# Patient Record
Sex: Female | Born: 1985 | Race: White | Hispanic: No | Marital: Married | Smoking: Current every day smoker
Health system: Southern US, Community
[De-identification: ages and names within clinical notes are randomized; demographics above are authoritative.]

## PROBLEM LIST (undated history)

## (undated) DIAGNOSIS — A6920 Lyme disease, unspecified: Secondary | ICD-10-CM

## (undated) HISTORY — PX: TONSILLECTOMY: SUR1361

## (undated) HISTORY — PX: TUBAL LIGATION: SHX77

## (undated) HISTORY — PX: ABDOMINAL SURGERY: SHX537

## (undated) HISTORY — PX: WISDOM TOOTH EXTRACTION: SHX21

---

## 2016-05-22 ENCOUNTER — Encounter: Payer: Self-pay | Admitting: Obstetrics & Gynecology

## 2016-05-24 ENCOUNTER — Emergency Department (HOSPITAL_COMMUNITY)
Admission: EM | Admit: 2016-05-24 | Discharge: 2016-05-24 | Disposition: A | Discharge status: Home / Self Care | Payer: Medicaid Other | Attending: Emergency Medicine | Admitting: Emergency Medicine

## 2016-05-24 ENCOUNTER — Encounter (HOSPITAL_COMMUNITY): Payer: Self-pay | Admitting: Emergency Medicine

## 2016-05-24 ENCOUNTER — Emergency Department (HOSPITAL_COMMUNITY): Payer: Medicaid Other

## 2016-05-24 DIAGNOSIS — F17210 Nicotine dependence, cigarettes, uncomplicated: Secondary | ICD-10-CM | POA: Diagnosis not present

## 2016-05-24 DIAGNOSIS — R1032 Left lower quadrant pain: Secondary | ICD-10-CM | POA: Diagnosis present

## 2016-05-24 DIAGNOSIS — Z792 Long term (current) use of antibiotics: Secondary | ICD-10-CM | POA: Diagnosis not present

## 2016-05-24 DIAGNOSIS — N760 Acute vaginitis: Secondary | ICD-10-CM | POA: Insufficient documentation

## 2016-05-24 DIAGNOSIS — R102 Pelvic and perineal pain: Secondary | ICD-10-CM | POA: Diagnosis not present

## 2016-05-24 DIAGNOSIS — R1030 Lower abdominal pain, unspecified: Secondary | ICD-10-CM

## 2016-05-24 DIAGNOSIS — B9689 Other specified bacterial agents as the cause of diseases classified elsewhere: Secondary | ICD-10-CM

## 2016-05-24 HISTORY — DX: Lyme disease, unspecified: A69.20

## 2016-05-24 LAB — URINALYSIS, ROUTINE W REFLEX MICROSCOPIC
Bilirubin Urine: NEGATIVE
Glucose, UA: NEGATIVE mg/dL
HGB URINE DIPSTICK: NEGATIVE
Ketones, ur: 40 mg/dL — AB
Leukocytes, UA: NEGATIVE
NITRITE: NEGATIVE
PROTEIN: NEGATIVE mg/dL
Specific Gravity, Urine: 1.02 (ref 1.005–1.030)
pH: 7 (ref 5.0–8.0)

## 2016-05-24 LAB — CBC
HCT: 42.4 % (ref 36.0–46.0)
Hemoglobin: 14.7 g/dL (ref 12.0–15.0)
MCH: 32.3 pg (ref 26.0–34.0)
MCHC: 34.7 g/dL (ref 30.0–36.0)
MCV: 93.2 fL (ref 78.0–100.0)
PLATELETS: 192 10*3/uL (ref 150–400)
RBC: 4.55 MIL/uL (ref 3.87–5.11)
RDW: 12.6 % (ref 11.5–15.5)
WBC: 6.8 10*3/uL (ref 4.0–10.5)

## 2016-05-24 LAB — LIPASE, BLOOD: Lipase: 19 U/L (ref 11–51)

## 2016-05-24 LAB — COMPREHENSIVE METABOLIC PANEL
ALK PHOS: 39 U/L (ref 38–126)
ALT: 13 U/L — AB (ref 14–54)
ANION GAP: 6 (ref 5–15)
AST: 17 U/L (ref 15–41)
Albumin: 4.9 g/dL (ref 3.5–5.0)
BILIRUBIN TOTAL: 2.5 mg/dL — AB (ref 0.3–1.2)
BUN: 10 mg/dL (ref 6–20)
CALCIUM: 9.4 mg/dL (ref 8.9–10.3)
CO2: 25 mmol/L (ref 22–32)
CREATININE: 0.54 mg/dL (ref 0.44–1.00)
Chloride: 107 mmol/L (ref 101–111)
Glucose, Bld: 98 mg/dL (ref 65–99)
Potassium: 3.8 mmol/L (ref 3.5–5.1)
Sodium: 138 mmol/L (ref 135–145)
TOTAL PROTEIN: 8 g/dL (ref 6.5–8.1)

## 2016-05-24 LAB — WET PREP, GENITAL
Sperm: NONE SEEN
TRICH WET PREP: NONE SEEN
YEAST WET PREP: NONE SEEN

## 2016-05-24 LAB — HCG, QUANTITATIVE, PREGNANCY: hCG, Beta Chain, Quant, S: <1 m[IU]/mL (ref <5)

## 2016-05-24 MED ORDER — MORPHINE SULFATE (PF) 4 MG/ML IV SOLN
INTRAVENOUS | Status: AC
  Filled 2016-05-24: qty 1

## 2016-05-24 MED ORDER — IOPAMIDOL (ISOVUE-300) INJECTION 61%
INTRAVENOUS | Status: AC
  Administered 2016-05-24: 30 mL
  Filled 2016-05-24: qty 30

## 2016-05-24 MED ORDER — IOPAMIDOL (ISOVUE-300) INJECTION 61%
75.0000 mL | Freq: Once | INTRAVENOUS | Status: AC | PRN
  Administered 2016-05-24: 75 mL via INTRAVENOUS

## 2016-05-24 MED ORDER — MORPHINE SULFATE (PF) 2 MG/ML IV SOLN: 4.0000 mg | Freq: Once | INTRAVENOUS | Status: DC

## 2016-05-24 MED ORDER — METRONIDAZOLE 500 MG PO TABS: 500.0000 mg | ORAL_TABLET | Freq: Two times a day (BID) | ORAL | 0 refills | Status: DC

## 2016-05-24 MED ORDER — MORPHINE SULFATE (PF) 4 MG/ML IV SOLN
4.0000 mg | Freq: Once | INTRAVENOUS | Status: AC
  Administered 2016-05-24: 4 mg via INTRAVENOUS

## 2016-05-24 MED ORDER — ONDANSETRON HCL 4 MG/2ML IJ SOLN
4.0000 mg | Freq: Once | INTRAMUSCULAR | Status: AC
  Administered 2016-05-24: 4 mg via INTRAVENOUS
  Filled 2016-05-24: qty 2

## 2016-05-24 NOTE — Discharge Instructions (Signed)
Your CT scan does not show anything acute causing your pain.  Please follow-up with your primary care doctor as needed. You given gynecology follow-up as needed. Return for worsening symptoms including fever, intractable vomiting, or any other symptoms concerning to you.

## 2016-05-24 NOTE — ED Provider Notes (Signed)
[facility] DEPT Provider Note   CSN: [id number] Arrival date & time: 05/24/16  1356     History   Chief Complaint Chief Complaint  Patient presents with  . Abdominal Pain    HPI Courtney Jacobson is a 30 y.o. female.  HPI 30 -year-old female who presents with left lower quadrant abdominal pain. She has a history of 3 cesarean sections and a pregnancy complicated by uterine rupture in 2015. States that she had a complicated repair when she was in [place] and small bowel resection. States that she has had some long-standing abdominal pain since then that is mild and intermittent in nature. States that she turned in bed this morning, and had sudden onset of severe, sharp left lower quadrant abdominal pain associated with diaphoresis and nausea. Has not tried anything to treat pain. No aggravating or alleviating factors. Pain constant and unchanged since then. States that she had her last menstruation 3 days ago, and it was unusual because it lasted for about a month. No abnormal vaginal discharge, vomiting, diarrhea, constipation, dysuria, urinary frequency, or hematuria.    Past Medical History:  Diagnosis Date  . Lyme disease     There are no active problems to display for this patient.   Past Surgical History:  Procedure Laterality Date  . CESAREAN SECTION      OB History    Gravida Para Term Preterm AB Living   7 6 3 3 1      SAB TAB Ectopic Multiple Live Births   1               Home Medications    Prior to Admission medications   Medication Sig Start Date End Date Taking? Authorizing Provider  metroNIDAZOLE (FLAGYL) 500 MG tablet Take 1 tablet (500 mg total) by mouth 2 (two) times daily. 05/24/16   Lavera Guiseana Duo Tea Collums, MD    Family History History reviewed. No pertinent family history.  Social History Social History  Substance Use Topics  . Smoking status: Current Every Day Smoker    Packs/day: 0.50    Types: Cigarettes  . Smokeless tobacco: Never Used  . Alcohol  use No     Allergies   Benadryl [diphenhydramine hcl (sleep)]; Ceclor [cefaclor]; Ibuprofen; and Vancomycin   Review of Systems Review of Systems 10/14 systems reviewed and are negative other than those stated in the HPI   Physical Exam Updated Vital Signs BP 115/76 (BP Location: Right Arm)   Pulse 75   Temp 98.2 F (36.8 C) (Oral)   Resp 17   Ht 5\' 5"  (1.651 m)   Wt 90 lb (40.8 kg)   LMP 05/17/2016 (Approximate) Comment: irregular periods  SpO2 100%   BMI 14.98 kg/m   Physical Exam Physical Exam  Nursing note and vitals reviewed. Constitutional: Well developed, well nourished, non-toxic, and in no acute distress Head: Normocephalic and atraumatic.  Mouth/Throat: Oropharynx is clear and moist.  Neck: Normal range of motion. Neck supple.  Cardiovascular: Normal rate and regular rhythm.   Pulmonary/Chest: Effort normal and breath sounds normal.  Abdominal: Soft. There is LLQ, left adnexal, and suprapubic tenderness. There is no rebound and no guarding.  Pelvic: Normal external genitalia. Normal internal genitalia. No discharge. No blood within the vagina. No cervical motion tenderness. No adnexal masses. Left adnexal tenderness. Musculoskeletal: Normal range of motion.  Neurological: Alert, no facial droop, fluent speech, moves all extremities symmetrically Skin: Skin is warm and dry.  Psychiatric: Cooperative   ED Treatments / Results  Labs (all labs ordered are listed, but only abnormal results are displayed) Labs Reviewed  WET PREP, GENITAL - Abnormal; Notable for the following:       Result Value   Clue Cells Wet Prep HPF POC RARE (*)    WBC, Wet Prep HPF POC RARE (*)    All other components within normal limits  COMPREHENSIVE METABOLIC PANEL - Abnormal; Notable for the following:    ALT 13 (*)    Total Bilirubin 2.5 (*)    All other components within normal limits  URINALYSIS, ROUTINE W REFLEX MICROSCOPIC (NOT AT [facility]) - Abnormal; Notable for the following:     APPearance HAZY (*)    Ketones, ur 40 (*)    All other components within normal limits  LIPASE, BLOOD  CBC  HCG, QUANTITATIVE, PREGNANCY  GC/CHLAMYDIA PROBE AMP ([place]) NOT AT [facility]    EKG  EKG Interpretation None       Radiology Ct Abdomen Pelvis W Contrast  Addendum Date: 05/24/2016   ADDENDUM REPORT: 05/24/2016 19:41 ADDENDUM: The statement in the impression regarding intrarenal calculi is in error. There are multiple small nonobstructing calculi throughout both kidneys. No hydronephrosis evident. Electronically Signed   By: Bretta Bang III M.D.   On: 05/24/2016 19:41   Result Date: 05/24/2016 CLINICAL DATA:  Lower abdominal pain, primarily left-sided, with nausea EXAM: CT ABDOMEN AND PELVIS WITH CONTRAST TECHNIQUE: Multidetector CT imaging of the abdomen and pelvis was performed using the standard protocol following bolus administration of intravenous contrast. Oral contrast was also administered. CONTRAST:  75mL ISOVUE-300 IOPAMIDOL (ISOVUE-300) INJECTION 61% COMPARISON:  None. FINDINGS: Lower chest: Lung bases are clear. Hepatobiliary: There are scattered tiny foci of decreased attenuation, likely cysts, in the liver. Liver otherwise appears unremarkable. Gallbladder wall is not appreciably thickened. There is no biliary duct dilatation. Pancreas: No pancreatic mass or inflammatory focus. Spleen: No splenic lesions are evident. Adrenals/Urinary Tract: Adrenals appear normal bilaterally. There is no renal mass or hydronephrosis on either side. There are scattered calculi throughout the kidneys on each side ranging in size from between 1 and 3 mm. There is no evident ureteral calculus on either side. Urinary bladder is midline with wall thickness within normal limits. Stomach/Bowel: There is no appreciable bowel wall or mesenteric thickening. No bowel obstruction. No free air or portal venous air. Vascular/Lymphatic: There is no abdominal aortic aneurysm. No vascular lesions  are evident. No adenopathy is appreciable in the abdomen pelvis. Reproductive: Uterus is anteverted. There is no appreciable pelvic mass or pelvic fluid collection. Other: Appendix appears normal. There is no abscess or ascites in the abdomen or pelvis. Musculoskeletal: There is benign-appearing sclerosis in the upper left sacral ala. No lytic or destructive bone lesions are identified. No abdominal wall or intramuscular lesion evident. IMPRESSION: No bowel obstruction. No abscess. No evidence of diverticulitis. Appendix appears normal. No renal or ureteral calculus. No hydronephrosis. A cause for patient's symptoms has not been established with this study. Electronically Signed: By: Bretta Bang III M.D. On: 05/24/2016 19:22    Procedures Procedures (including critical care time)  Medications Ordered in ED Medications  ondansetron (ZOFRAN) injection 4 mg (4 mg Intravenous Given 05/24/16 1622)  morphine 4 MG/ML injection 4 mg (4 mg Intravenous Given 05/24/16 1622)  iopamidol (ISOVUE-300) 61 % injection (30 mLs  Contrast Given 05/24/16 1644)  iopamidol (ISOVUE-300) 61 % injection 75 mL (75 mLs Intravenous Contrast Given 05/24/16 1845)     Initial Impression / Assessment and Plan / ED  Course  I have reviewed the triage vital signs and the nursing notes.  Pertinent labs & imaging results that were available during my care of the patient were reviewed by me and considered in my medical decision making (see chart for details).  Clinical Course    30 year old who presents with left-sided abdominal pain with nausea and vomiting. Nontoxic in no acute distress and afebrile and hemodynamically stable. With left lower quadrant left adnexal tenderness to palpation. CT obtained to evaluation for ovarian cyst/torsion, obstruction, renal stone, or other acute processes in abdomen/pelvis. This is normal. With BV on wet prep and will treat w/ flagyl. Blood work and urine overall unremarkable.  The patient  appears reasonably screened and/or stabilized for discharge and I doubt any other medical condition or other [facility] requiring further screening, evaluation, or treatment in the ED at this time prior to discharge.  Strict return and follow-up instructions reviewed. She/He expressed understanding of all discharge instructions and felt comfortable with the plan of care.   Final Clinical Impressions(s) / ED Diagnoses   Final diagnoses:  Lower abdominal pain  Pelvic pain in female  Bacterial vaginosis    New Prescriptions New Prescriptions   METRONIDAZOLE (FLAGYL) 500 MG TABLET    Take 1 tablet (500 mg total) by mouth 2 (two) times daily.     Lavera Guise, MD 05/24/16 [phone]

## 2016-05-24 NOTE — ED Triage Notes (Signed)
Pt reports severe abdominal pain, nausea and diaphoresis that started this am. Pt reports hx of same. Pt states when she delivered her som her "uterus busted open and they put it back together." Pt states she has had recurrent pain since.

## 2016-05-25 LAB — GC/CHLAMYDIA PROBE AMP (~~LOC~~) NOT AT ARMC
CHLAMYDIA, DNA PROBE: NEGATIVE
NEISSERIA GONORRHEA: NEGATIVE

## 2016-06-04 ENCOUNTER — Ambulatory Visit (INDEPENDENT_AMBULATORY_CARE_PROVIDER_SITE_OTHER): Payer: Medicaid Other | Admitting: Obstetrics & Gynecology

## 2016-06-04 ENCOUNTER — Encounter: Payer: Self-pay | Admitting: Obstetrics & Gynecology

## 2016-06-04 ENCOUNTER — Other Ambulatory Visit (HOSPITAL_COMMUNITY)
Admission: RE | Admit: 2016-06-04 | Discharge: 2016-06-04 | Disposition: A | Discharge status: Home / Self Care | Payer: Medicaid Other | Source: Ambulatory Visit | Attending: Obstetrics & Gynecology | Admitting: Obstetrics & Gynecology

## 2016-06-04 VITALS — BP 70/50 | HR 74 | Ht 65.0 in | Wt 92.0 lb

## 2016-06-04 DIAGNOSIS — N946 Dysmenorrhea, unspecified: Secondary | ICD-10-CM

## 2016-06-04 DIAGNOSIS — Z01419 Encounter for gynecological examination (general) (routine) without abnormal findings: Secondary | ICD-10-CM | POA: Diagnosis present

## 2016-06-04 DIAGNOSIS — N921 Excessive and frequent menstruation with irregular cycle: Secondary | ICD-10-CM | POA: Diagnosis not present

## 2016-06-04 DIAGNOSIS — E038 Other specified hypothyroidism: Secondary | ICD-10-CM

## 2016-06-04 DIAGNOSIS — Z124 Encounter for screening for malignant neoplasm of cervix: Secondary | ICD-10-CM | POA: Diagnosis not present

## 2016-06-04 DIAGNOSIS — R1032 Left lower quadrant pain: Secondary | ICD-10-CM | POA: Diagnosis not present

## 2016-06-04 DIAGNOSIS — N9412 Deep dyspareunia: Secondary | ICD-10-CM | POA: Diagnosis not present

## 2016-06-04 MED ORDER — MEGESTROL ACETATE 40 MG PO TABS: ORAL_TABLET | ORAL | 3 refills | Status: DC

## 2016-06-04 NOTE — Progress Notes (Signed)
Chief Complaint  Patient presents with  . heavy bleeding/ pain    bleed 3 week last month    Blood pressure (!) 70/50, pulse 74, height 5\' 5"  (1.651 m), weight 92 lb (41.7 kg), last menstrual period 06/04/2016.  30 y.o. [id number] Patient's last menstrual period was 06/04/2016 (exact date). The current method of family planning is tubal ligation.  Outpatient Encounter Prescriptions as of 06/04/2016  Medication Sig  . [DISCONTINUED] HYDROcodone-acetaminophen (NORCO/VICODIN) 5-325 MG tablet Take 1 tablet by mouth every 6 (six) hours as needed for moderate pain.  . [DISCONTINUED] penicillin v potassium (VEETID) 500 MG tablet Take 500 mg by mouth 4 (four) times daily.  . [DISCONTINUED] megestrol (MEGACE) 40 MG tablet 3 tablets a day for 5 days, 2 tablets a day for 5 days then 1 tablet daily  . [DISCONTINUED] metroNIDAZOLE (FLAGYL) 500 MG tablet Take 1 tablet (500 mg total) by mouth 2 (two) times daily.   No facility-administered encounter medications on file as of 06/04/2016.     Subjective Pt with long standing history of heavy painful periods ith deep dyspareunia and chronic LLQ pain I think is related to adhesions, C sections x4  Objective General WDWN female NAD Vulva:  normal appearing vulva with no masses, tenderness or lesions Vagina:  normal mucosa, no discharge Cervix:  no bleeding following Pap, no lesions and pain with palpation Uterus:  uteru sis tender to palpation Adnexa: ovaries:present,  normal adnexa in size, nontender and no masses, tenderness left   Pertinent ROS No burning with urination, frequency or urgency No nausea, vomiting or diarrhea Nor fever chills or other constitutional symptoms   Labs or studies     Impression Diagnoses this Encounter::   ICD-9-CM ICD-10-CM   1. Menometrorrhagia 626.2 N92.1 US Pelvis Complete     US Transvaginal Non-OB  2. Dysmenorrhea 625.3 N94.6 US Pelvis Complete     US Transvaginal Non-OB  3. Deep dyspareunia  625.0 N94.12 US Pelvis Complete     US Transvaginal Non-OB  4. Left lower quadrant pain 789.04 R10.32 US Pelvis Complete     US Transvaginal Non-OB  5. Other specified hypothyroidism 244.8 E03.8 TSH  6. Pap smear for cervical cancer screening V76.2 Z12.4 Cytology - PAP    Established relevant diagnosis(es):   Plan/Recommendations: Meds ordered this encounter  Medications  . DISCONTD: penicillin v potassium (VEETID) 500 MG tablet    Sig: Take 500 mg by mouth 4 (four) times daily.  Marland Kitchen DISCONTD: HYDROcodone-acetaminophen (NORCO/VICODIN) 5-325 MG tablet    Sig: Take 1 tablet by mouth every 6 (six) hours as needed for moderate pain.  Marland Kitchen DISCONTD: megestrol (MEGACE) 40 MG tablet    Sig: 3 tablets a day for 5 days, 2 tablets a day for 5 days then 1 tablet daily    Dispense:  45 tablet    Refill:  3    Labs or Scans Ordered: Orders Placed This Encounter  Procedures  . US Pelvis Complete  . US Transvaginal Non-OB  . TSH    Management:: Sonogram 3 weeks  Follow up No Follow-up on file.          All questions were answered.  Past Medical History:  Diagnosis Date  . Lyme disease     Past Surgical History:  Procedure Laterality Date  . ABDOMINAL SURGERY    . CESAREAN SECTION    . TONSILLECTOMY    . TUBAL LIGATION    . WISDOM TOOTH EXTRACTION  OB History    Gravida Para Term Preterm AB Living   7 6 3 3 1      SAB TAB Ectopic Multiple Live Births   1              Allergies  Allergen Reactions  . Benadryl [Diphenhydramine Hcl (Sleep)] Hives  . Ceclor [Cefaclor] Hives  . Ibuprofen Swelling  . Vancomycin Hives    Social History   Social History  . Marital status: Married    Spouse name: N/A  . Number of children: N/A  . Years of education: N/A   Social History Main Topics  . Smoking status: Current Every Day Smoker    Packs/day: 0.50    Types: Cigarettes  . Smokeless tobacco: Never Used  . Alcohol use No  . Drug use: No  . Sexual activity: No    Other Topics Concern  . Not on file   Social History Narrative  . No narrative on file    No family history on file.       Menometrorrhagia - Plan: US Pelvis Complete, US Transvaginal Non-OB  Dysmenorrhea - Plan: US Pelvis Complete, US Transvaginal Non-OB  Deep dyspareunia - Plan: US Pelvis Complete, US Transvaginal Non-OB  Left lower quadrant pain - Plan: US Pelvis Complete, US Transvaginal Non-OB  Other specified hypothyroidism - Plan: TSH  Pap smear for cervical cancer screening - Plan: Cytology - PAP    Prescription Drug Management:   New Prescriptions:  Meds ordered this encounter  Medications  . DISCONTD: penicillin v potassium (VEETID) 500 MG tablet    Sig: Take 500 mg by mouth 4 (four) times daily.  Marland Kitchen. DISCONTD: HYDROcodone-acetaminophen (NORCO/VICODIN) 5-325 MG tablet    Sig: Take 1 tablet by mouth every 6 (six) hours as needed for moderate pain.  Marland Kitchen. DISCONTD: megestrol (MEGACE) 40 MG tablet    Sig: 3 tablets a day for 5 days, 2 tablets a day for 5 days then 1 tablet daily    Dispense:  45 tablet    Refill:  3       Follow Up:   3 weeks                            No results found for this or any previous visit (from the past 2160 hour(s)).

## 2016-06-05 LAB — CYTOLOGY - PAP

## 2016-06-08 ENCOUNTER — Telehealth: Payer: Self-pay | Admitting: *Deleted

## 2016-06-08 NOTE — Telephone Encounter (Signed)
Pt informed of WNL pap results from 06/04/2016.

## 2016-06-10 LAB — TSH: TSH: 0.654 u[IU]/mL (ref 0.450–4.500)

## 2016-06-16 ENCOUNTER — Telehealth: Payer: Self-pay | Admitting: Obstetrics & Gynecology

## 2016-06-16 NOTE — Telephone Encounter (Signed)
Pt informed Pap from 06/04/2016 WNL, TSH 0.654. Pt verbalized understanding.

## 2016-06-16 NOTE — Telephone Encounter (Signed)
Pt called stating that she would like to know the results of her blood work. Please contact pt °

## 2016-06-19 ENCOUNTER — Telehealth: Payer: Self-pay | Admitting: *Deleted

## 2016-06-19 NOTE — Telephone Encounter (Signed)
Pt c/o vaginal bleeding and abdominal pain, diarrhea, hot flashes. Taking Megace but is not helping. Pt states at her appt when Dr.Eure went to do pelvic exam she was in so much pain he had to stop. Pt given an appt for Monday, 06/22/2016 for evaluation and to discuss possible surgery.

## 2016-06-22 ENCOUNTER — Encounter: Payer: Self-pay | Admitting: Obstetrics & Gynecology

## 2016-06-22 ENCOUNTER — Ambulatory Visit (INDEPENDENT_AMBULATORY_CARE_PROVIDER_SITE_OTHER): Payer: Medicaid Other | Admitting: Obstetrics & Gynecology

## 2016-06-22 VITALS — BP 90/60 | HR 76 | Ht 65.0 in | Wt 91.0 lb

## 2016-06-22 DIAGNOSIS — N946 Dysmenorrhea, unspecified: Secondary | ICD-10-CM | POA: Diagnosis not present

## 2016-06-22 DIAGNOSIS — N921 Excessive and frequent menstruation with irregular cycle: Secondary | ICD-10-CM | POA: Diagnosis not present

## 2016-06-22 DIAGNOSIS — N9412 Deep dyspareunia: Secondary | ICD-10-CM

## 2016-06-22 DIAGNOSIS — N939 Abnormal uterine and vaginal bleeding, unspecified: Secondary | ICD-10-CM

## 2016-06-22 DIAGNOSIS — N938 Other specified abnormal uterine and vaginal bleeding: Secondary | ICD-10-CM | POA: Diagnosis not present

## 2016-06-22 DIAGNOSIS — R1032 Left lower quadrant pain: Secondary | ICD-10-CM

## 2016-06-22 LAB — POCT HEMOGLOBIN: Hemoglobin: 13.1 g/dL (ref 12.2–16.2)

## 2016-06-22 MED ORDER — MEGESTROL ACETATE 40 MG PO TABS: ORAL_TABLET | ORAL | 3 refills | Status: DC

## 2016-06-22 NOTE — Progress Notes (Signed)
Chief Complaint  Patient presents with  . work-in- gyn    vagina bleeding and pain. c/o dizzy, weak, hot flashes.    Blood pressure 90/60, pulse 76, height 5\' 5"  (1.651 m), weight 91 lb (41.3 kg), last menstrual period 06/04/2016.  30 y.o. [id number] Patient's last menstrual period was 06/04/2016 (exact date). The current method of family planning is tubal ligation.  Outpatient Encounter Prescriptions as of 06/22/2016  Medication Sig  . [DISCONTINUED] megestrol (MEGACE) 40 MG tablet 3 tablets a day for 5 days, 2 tablets a day for 5 days then 1 tablet daily  . [DISCONTINUED] megestrol (MEGACE) 40 MG tablet Take 3 tablets daily  . [DISCONTINUED] HYDROcodone-acetaminophen (NORCO/VICODIN) 5-325 MG tablet Take 1 tablet by mouth every 6 (six) hours as needed for moderate pain.  . [DISCONTINUED] penicillin v potassium (VEETID) 500 MG tablet Take 500 mg by mouth 4 (four) times daily.   No facility-administered encounter medications on file as of 06/22/2016.     Subjective Pt doing poorly on the megestrol, still with pain not bleeding as before hot flashes Weak Doesn't like the side effects  Objective   Pertinent ROS   Labs or studies     Impression Diagnoses this Encounter::   ICD-9-CM ICD-10-CM   1. Menometrorrhagia 626.2 N92.1   2. Vaginal bleeding 623.8 N93.9 POCT hemoglobin  3. Dysmenorrhea 625.3 N94.6   4. Deep dyspareunia 625.0 N94.12   5. Left lower quadrant pain 789.04 R10.32     Established relevant diagnosis(es):   Plan/Recommendations: Meds ordered this encounter  Medications  . DISCONTD: megestrol (MEGACE) 40 MG tablet    Sig: Take 3 tablets daily    Dispense:  90 tablet    Refill:  3    Labs or Scans Ordered: Orders Placed This Encounter  Procedures  . POCT hemoglobin    Management:: Proceed with sonogram and follow up based on that  Follow up Return for keep.        Face to face time:  15 minutes  Greater than 50% of the visit  time was spent in counseling and coordination of care with the patient.  The summary and outline of the counseling and care coordination is summarized in the note above.   All questions were answered.  Past Medical History:  Diagnosis Date  . Lyme disease     Past Surgical History:  Procedure Laterality Date  . ABDOMINAL SURGERY    . CESAREAN SECTION    . TONSILLECTOMY    . TUBAL LIGATION    . WISDOM TOOTH EXTRACTION      OB History    Gravida Para Term Preterm AB Living   7 6 3 3 1      SAB TAB Ectopic Multiple Live Births   1              Allergies  Allergen Reactions  . Benadryl [Diphenhydramine Hcl (Sleep)] Hives  . Ceclor [Cefaclor] Hives  . Ibuprofen Swelling  . Vancomycin Hives    Social History   Social History  . Marital status: Married    Spouse name: N/A  . Number of children: N/A  . Years of education: N/A   Social History Main Topics  . Smoking status: Current Every Day Smoker    Packs/day: 0.50    Types: Cigarettes  . Smokeless tobacco: Never Used  . Alcohol use No  . Drug use: No  . Sexual activity: No   Other Topics Concern  . None  Social History Narrative  . None    History reviewed. No pertinent family history.

## 2016-07-02 ENCOUNTER — Encounter: Payer: Self-pay | Admitting: Obstetrics & Gynecology

## 2016-07-02 ENCOUNTER — Ambulatory Visit (INDEPENDENT_AMBULATORY_CARE_PROVIDER_SITE_OTHER): Payer: Medicaid Other | Admitting: Obstetrics & Gynecology

## 2016-07-02 ENCOUNTER — Ambulatory Visit: Payer: Medicaid Other | Admitting: Obstetrics & Gynecology

## 2016-07-02 ENCOUNTER — Ambulatory Visit (INDEPENDENT_AMBULATORY_CARE_PROVIDER_SITE_OTHER): Payer: Medicaid Other

## 2016-07-02 VITALS — BP 80/50 | HR 66 | Ht 64.0 in | Wt 94.0 lb

## 2016-07-02 DIAGNOSIS — N921 Excessive and frequent menstruation with irregular cycle: Secondary | ICD-10-CM

## 2016-07-02 DIAGNOSIS — N939 Abnormal uterine and vaginal bleeding, unspecified: Secondary | ICD-10-CM

## 2016-07-02 DIAGNOSIS — R1032 Left lower quadrant pain: Secondary | ICD-10-CM | POA: Diagnosis not present

## 2016-07-02 DIAGNOSIS — N946 Dysmenorrhea, unspecified: Secondary | ICD-10-CM | POA: Diagnosis not present

## 2016-07-02 DIAGNOSIS — N9412 Deep dyspareunia: Secondary | ICD-10-CM | POA: Diagnosis not present

## 2016-07-02 DIAGNOSIS — N854 Malposition of uterus: Secondary | ICD-10-CM

## 2016-07-02 MED ORDER — HYDROCODONE-ACETAMINOPHEN 5-325 MG PO TABS: 1.0000 | ORAL_TABLET | Freq: Four times a day (QID) | ORAL | 0 refills | Status: DC | PRN

## 2016-07-02 NOTE — Progress Notes (Signed)
Follow up appointment for results  Chief Complaint  Patient presents with  . Follow-up    ultrasound today    Blood pressure (!) 80/50, pulse 66, height 5\' 4"  (1.626 m), weight 94 lb (42.6 kg), last menstrual period 06/04/2016.  [place] Transvaginal Non-ob  Result Date: 07/02/2016 CLINICAL DATA: as above Ultrasound was provided for use by the ordering physician, and a technical charge was applied by the performing facility.  No radiologist interpretation/professional services rendered.   [place] Pelvis Complete  Result Date: 07/02/2016 GYNECOLOGIC SONOGRAM Courtney PolioLisa Jacobson is a 30 y.o. [id number] LMP 06/04/2016 for a pelvic sonogram for menometrorrhagia,dysmenorrhea,and dyspareunia. Uterus                      8.9 x 5.6 x 5.2 cm, homogeneous anteverted uterus,wnl Endometrium          5.2 mm, symmetrical, wnl Right ovary             3.9 x 3.4 x 2.7 cm, wnl Left ovary                4.3 x 3.7 x 2 cm, wnl No free fluid seen. Technician Comments: PELVIC US TA/TV: Homogeneous anteverted uterus,wnl,EEC 5.2 mm,normal ov's bilat,no free fluid seen,ov's appear mobile,pelvic pain during ultrasound Courtney Jacobson Courtney Jacobson 07/02/2016 12:16 PM Clinical Impression and recommendations: I have reviewed the sonogram results above, combined with the patient's current clinical course, below are my impressions and any appropriate recommendations for management based on the sonographic findings. Normal pelvic sonogram, no anatomical abnormalities +pain during evaluation Courtney Jacobson 07/02/2016 12:25 PM      MEDS ordered this encounter: Meds ordered this encounter  Medications  . amoxicillin (AMOXIL) 500 MG tablet    Sig: Take 500 mg by mouth 3 (three) times daily.  Marland Kitchen. HYDROcodone-acetaminophen (NORCO/VICODIN) 5-325 MG tablet    Sig: Take 1 tablet by mouth every 6 (six) hours as needed.    Dispense:  30 tablet    Refill:  0    Orders for this encounter: No orders of the defined types were placed in this encounter.   Impression: 1.  Vaginal bleeding   2. Menometrorrhagia   3. Dysmenorrhea   4. Deep dyspareunia   5. Left lower quadrant pain    Plan: Considering TAH LSO try to preserve the right ovary   Follow Up: Return in about 2 months (around 09/03/2016) for pre op, with Dr Courtney HiddenEure.       Face to face time:  10 minutes  Greater than 50% of the visit time was spent in counseling and coordination of care with the patient.  The summary and outline of the counseling and care coordination is summarized in the note above.   All questions were answered.  Past Medical History:  Diagnosis Date  . Lyme disease     Past Surgical History:  Procedure Laterality Date  . CESAREAN SECTION    . WISDOM TOOTH EXTRACTION      OB History    Gravida Para Term Preterm AB Living   7 6 3 3 1      SAB TAB Ectopic Multiple Live Births   1              Allergies  Allergen Reactions  . Benadryl [Diphenhydramine Hcl (Sleep)] Hives  . Ceclor [Cefaclor] Hives  . Ibuprofen Swelling  . Vancomycin Hives    Social History   Social History  . Marital status: Married    Spouse name: N/A  .  Number of children: N/A  . Years of education: N/A   Social History Main Topics  . Smoking status: Current Every Day Smoker    Packs/day: 0.50    Types: Cigarettes  . Smokeless tobacco: Never Used  . Alcohol use No  . Drug use: No  . Sexual activity: No   Other Topics Concern  . None   Social History Narrative  . None    History reviewed. No pertinent family history.

## 2016-07-02 NOTE — Progress Notes (Signed)
PELVIC US TA/TV: Homogeneous anteverted uterus,wnl,EEC 5.2 mm,normal ov's bilat,no free fluid seen,ov's appear mobile,pelvic pain during ultrasound

## 2016-07-29 ENCOUNTER — Ambulatory Visit: Payer: Medicaid Other | Admitting: Advanced Practice Midwife

## 2016-08-15 ENCOUNTER — Emergency Department (HOSPITAL_COMMUNITY)
Admission: EM | Admit: 2016-08-15 | Discharge: 2016-08-15 | Disposition: A | Discharge status: Home / Self Care | Payer: Medicaid Other | Attending: Emergency Medicine | Admitting: Emergency Medicine

## 2016-08-15 ENCOUNTER — Encounter (HOSPITAL_COMMUNITY): Payer: Self-pay | Admitting: Emergency Medicine

## 2016-08-15 DIAGNOSIS — F17210 Nicotine dependence, cigarettes, uncomplicated: Secondary | ICD-10-CM | POA: Insufficient documentation

## 2016-08-15 DIAGNOSIS — J069 Acute upper respiratory infection, unspecified: Secondary | ICD-10-CM | POA: Insufficient documentation

## 2016-08-15 DIAGNOSIS — B9789 Other viral agents as the cause of diseases classified elsewhere: Secondary | ICD-10-CM

## 2016-08-15 DIAGNOSIS — R05 Cough: Secondary | ICD-10-CM | POA: Diagnosis present

## 2016-08-15 MED ORDER — LORATADINE-PSEUDOEPHEDRINE ER 5-120 MG PO TB12: 1.0000 | ORAL_TABLET | Freq: Two times a day (BID) | ORAL | 0 refills | Status: DC

## 2016-08-15 MED ORDER — IBUPROFEN 600 MG PO TABS: 600.0000 mg | ORAL_TABLET | Freq: Four times a day (QID) | ORAL | 0 refills | Status: DC | PRN

## 2016-08-15 NOTE — Discharge Instructions (Signed)
Please wash hands frequently. Please increase fluids. Use Tylenol every 4 hours, or ibuprofen every 6 hours for fever or aching. Use Claritin-D every 12 hours for congestion. Please rest is much as possible. Please see your Medicaid access physician, or return to the emergency department if any changes, problems, or concerns.

## 2016-08-15 NOTE — ED Provider Notes (Signed)
[facility]-EM[facility]Y DEPT Provider Note   CSN: [id number] Arrival date & time: 08/15/16  1951  By signing my name below, I, [name], attest that this documentation has been prepared under the direction and in the presence of Courtney QualeHobson Joseline Mccampbell, PA-C. Electronically Signed: [name], ED Scribe. 08/15/16. 8:26 PM.  History   Chief Complaint Chief Complaint  Patient presents with  . Cough   HPI Comments: Courtney Jacobson is a 30 y.o. female with h/o lyme disease and asthma who presents to the Emergency Department complaining of productive cough onset 3 days ago with associated mild bilateral ear pain and bilateral eye pain. Pt's symptoms are noted to be significantly worse at night. She has not tried any OTC medications PTA. Pt states her daughter is sick with similar symptoms. Pt denies fever, chills, or any other associated symptoms.   The history is provided by the patient. No language interpreter was used.  Cough  This is a new problem. The current episode started more than 2 days ago. The problem occurs constantly. The problem has not changed since onset.The cough is productive of sputum. There has been no fever. Associated symptoms include ear pain. Pertinent negatives include no chills. She has tried nothing for the symptoms. Her past medical history is significant for asthma.    Past Medical History:  Diagnosis Date  . Lyme disease     There are no active problems to display for this patient.   Past Surgical History:  Procedure Laterality Date  . ABDOMINAL SURGERY    . CESAREAN SECTION    . TONSILLECTOMY    . TUBAL LIGATION    . WISDOM TOOTH EXTRACTION      OB History    Gravida Para Term Preterm AB Living   7 6 3 3 1      SAB TAB Ectopic Multiple Live Births   1               Home Medications    Prior to Admission medications   Medication Sig Start Date End Date Taking? Authorizing Provider  amoxicillin (AMOXIL) 500 MG tablet Take 500 mg by mouth 3 (three) times daily.     Historical Provider, MD  HYDROcodone-acetaminophen (NORCO/VICODIN) 5-325 MG tablet Take 1 tablet by mouth every 6 (six) hours as needed. 07/02/16   Lazaro ArmsLuther H Eure, MD  megestrol (MEGACE) 40 MG tablet Take 3 tablets daily 06/22/16   Lazaro ArmsLuther H Eure, MD    Family History History reviewed. No pertinent family history.  Social History Social History  Substance Use Topics  . Smoking status: Current Every Day Smoker    Packs/day: 0.50    Types: Cigarettes  . Smokeless tobacco: Never Used  . Alcohol use No     Allergies   Benadryl [diphenhydramine hcl (sleep)]; Ceclor [cefaclor]; Ibuprofen; and Vancomycin   Review of Systems Review of Systems  Constitutional: Negative for chills and fever.  HENT: Positive for ear pain.   Respiratory: Positive for cough.   All other systems reviewed and are negative.    Physical Exam Updated Vital Signs BP 93/58 (BP Location: Left Arm)   Pulse 84   Temp 98.6 F (37 C) (Oral)   Resp 18   Ht 5\' 5"  (1.651 m)   Wt 95 lb (43.1 kg)   LMP 07/22/2016   SpO2 99%   BMI 15.81 kg/m   Physical Exam  Constitutional: She appears well-developed and well-nourished.  HENT:  Head: Normocephalic.  Right Ear: Tympanic membrane and external ear normal.  Left Ear: Tympanic membrane and external ear normal.  Mouth/Throat: Uvula is midline, oropharynx is clear and moist and mucous membranes are normal.  Nasal congestion present.  Eyes: Conjunctivae are normal.  Neck: Neck supple.  Cardiovascular: Normal rate, regular rhythm and normal heart sounds.  Exam reveals no gallop and no friction rub.   No murmur heard. Pulmonary/Chest: Effort normal and breath sounds normal. No respiratory distress. She has no wheezes. She has no rales.  Abdominal: She exhibits no distension.  Musculoskeletal: Normal range of motion.  Lymphadenopathy:    She has no cervical adenopathy.  Neurological: She is alert.  Skin: Skin is warm and dry.  Psychiatric: She has a normal mood  and affect. Her behavior is normal.  Nursing note and vitals reviewed.    ED Treatments / Results  DIAGNOSTIC STUDIES: Oxygen Saturation is 99% on RA, normal by my interpretation.    COORDINATION OF CARE: 8:26 PM Discussed treatment plan with pt at bedside and pt agreed to plan.  Labs (all labs ordered are listed, but only abnormal results are displayed) Labs Reviewed - No data to display  EKG  EKG Interpretation None       Radiology No results found.  Procedures Procedures (including critical care time)  Medications Ordered in ED Medications - No data to display   Initial Impression / Assessment and Plan / ED Course  I have reviewed the triage vital signs and the nursing notes.  Pertinent labs & imaging results that were available during my care of the patient were reviewed by me and considered in my medical decision making (see chart for details).  Clinical Course     **I have reviewed nursing notes, vital signs, and all appropriate lab and imaging results for this patient.*  Final Clinical Impressions(s) / ED Diagnoses   Final diagnoses:  None  MDM Pt symptoms consistent with URI. Temp controlled. Pt will be discharged with symptomatic treatment. Discussed return precautions. Pt is hemodynamically stable & in NAD prior to discharge. Pt will increase fluids. Use good hand washing.Advill and claritin D for symptoms.   New Prescriptions Discharge Medication List as of 08/15/2016  8:40 PM    START taking these medications   Details  ibuprofen (ADVIL,MOTRIN) 600 MG tablet Take 1 tablet (600 mg total) by mouth every 6 (six) hours as needed., Starting Sat 08/15/2016, Print    loratadine-pseudoephedrine (CLARITIN-D 12 HOUR) 5-120 MG tablet Take 1 tablet by mouth 2 (two) times daily., Starting Sat 08/15/2016, Print       **I personally performed the services described in this documentation, which was scribed in my presence. The recorded information has been  reviewed and is accurate.Courtney Jacobson*    Quanta Robertshaw, PA-C 08/16/16 1618    Courtney JesterKathleen McManus, DO 08/19/16 [phone]

## 2016-08-15 NOTE — ED Triage Notes (Signed)
Pt reports cough and bilat otalgia x 3 days. Productive cough with greenish colored sputum.

## 2016-08-22 ENCOUNTER — Encounter (HOSPITAL_COMMUNITY): Payer: Self-pay | Admitting: *Deleted

## 2016-08-22 ENCOUNTER — Emergency Department (HOSPITAL_COMMUNITY)
Admission: EM | Admit: 2016-08-22 | Discharge: 2016-08-22 | Disposition: A | Discharge status: Home / Self Care | Payer: Medicaid Other | Attending: Emergency Medicine | Admitting: Emergency Medicine

## 2016-08-22 DIAGNOSIS — H66002 Acute suppurative otitis media without spontaneous rupture of ear drum, left ear: Secondary | ICD-10-CM | POA: Diagnosis not present

## 2016-08-22 DIAGNOSIS — Z79899 Other long term (current) drug therapy: Secondary | ICD-10-CM | POA: Diagnosis not present

## 2016-08-22 DIAGNOSIS — F17210 Nicotine dependence, cigarettes, uncomplicated: Secondary | ICD-10-CM | POA: Diagnosis not present

## 2016-08-22 DIAGNOSIS — H9202 Otalgia, left ear: Secondary | ICD-10-CM | POA: Diagnosis present

## 2016-08-22 MED ORDER — CLINDAMYCIN HCL 300 MG PO CAPS: 300.0000 mg | ORAL_CAPSULE | Freq: Four times a day (QID) | ORAL | 0 refills | Status: DC

## 2016-08-22 MED ORDER — ACETAMINOPHEN 325 MG PO TABS
650.0000 mg | ORAL_TABLET | Freq: Once | ORAL | Status: AC
  Administered 2016-08-22: 650 mg via ORAL
  Filled 2016-08-22: qty 2

## 2016-08-22 MED ORDER — TRAMADOL HCL 50 MG PO TABS: 100.0000 mg | ORAL_TABLET | Freq: Four times a day (QID) | ORAL | 0 refills | Status: DC | PRN

## 2016-08-22 MED ORDER — CLINDAMYCIN HCL 150 MG PO CAPS
300.0000 mg | ORAL_CAPSULE | Freq: Once | ORAL | Status: AC
  Administered 2016-08-22: 300 mg via ORAL
  Filled 2016-08-22: qty 2

## 2016-08-22 MED ORDER — TRAMADOL HCL 50 MG PO TABS
100.0000 mg | ORAL_TABLET | Freq: Once | ORAL | Status: AC
  Administered 2016-08-22: 100 mg via ORAL
  Filled 2016-08-22: qty 2

## 2016-08-22 NOTE — ED Provider Notes (Signed)
[facility] Provider Note   CSN: [id number] Arrival date & time: 08/22/16  0101  Time seen 01:55 AM   History   Chief Complaint Chief Complaint  Patient presents with  . Otalgia    HPI Courtney Jacobson is a 30 y.o. female.  HPI  patient states she's had URI symptoms for about 10 days. She's had a cough which is improving and was having bilateral earaches. She denies fever or sore throat. She states about 12 midnight she was awakened with severe pain in her left ear. She states "it feels like a shovel is digging out my ear". She was seen in the ED on December 16 and diagnosed with a viral URI.  [facility]   Past Medical History:  Diagnosis Date  . Lyme disease     There are no active problems to display for this patient.   Past Surgical History:  Procedure Laterality Date  . ABDOMINAL SURGERY    . CESAREAN SECTION    . TONSILLECTOMY    . TUBAL LIGATION    . WISDOM TOOTH EXTRACTION      OB History    Gravida Para Term Preterm AB Living   7 6 3 3 1      SAB TAB Ectopic Multiple Live Births   1               Home Medications    Prior to Admission medications   Medication Sig Start Date End Date Taking? Authorizing Provider  clindamycin (CLEOCIN) 300 MG capsule Take 1 capsule (300 mg total) by mouth every 6 (six) hours. 08/22/16   Devoria AlbeIva Zahrah Sutherlin, MD  ibuprofen (ADVIL,MOTRIN) 600 MG tablet Take 1 tablet (600 mg total) by mouth every 6 (six) hours as needed. 08/15/16   Ivery QualeHobson Bryant, PA-C  loratadine-pseudoephedrine (CLARITIN-D 12 HOUR) 5-120 MG tablet Take 1 tablet by mouth 2 (two) times daily. 08/15/16   Ivery QualeHobson Bryant, PA-C  traMADol (ULTRAM) 50 MG tablet Take 2 tablets (100 mg total) by mouth every 6 (six) hours as needed. 08/22/16   Devoria AlbeIva Anis Degidio, MD    Family History No family history on file.  Social History Social History  Substance Use Topics  . Smoking status: Current Every Day Smoker    Packs/day: 0.50    Types: Cigarettes    . Smokeless tobacco: Never Used  . Alcohol use No  unemployed   Allergies   Benadryl [diphenhydramine hcl (sleep)]; Ceclor [cefaclor]; Ibuprofen; and Vancomycin   Review of Systems Review of Systems  All other systems reviewed and are negative.    Physical Exam Updated Vital Signs BP 111/66 (BP Location: Left Arm)   Pulse 66   Temp 97.9 F (36.6 C) (Oral)   Resp 18   Ht 5\' 5"  (1.651 m)   Wt 95 lb (43.1 kg)   LMP 08/21/2016   SpO2 100%   BMI 15.81 kg/m   Vital signs normal    Physical Exam  Constitutional: She is oriented to person, place, and time. She appears well-developed and well-nourished.  Non-toxic appearance. She does not appear ill. She appears distressed.  Appears painful holding her left ear  HENT:  Head: Normocephalic and atraumatic.  Right Ear: Tympanic membrane and external ear normal.  Left Ear: External ear normal.  Nose: Nose normal. No mucosal edema or rhinorrhea.  Mouth/Throat: Oropharynx is clear and moist and mucous membranes are normal. No dental abscesses or uvula swelling.  Patient's left TM has redness and bulging inferiorly and superiorly  with only a thin strip across the middle that still transparent.  Eyes: Conjunctivae and EOM are normal. Pupils are equal, round, and reactive to light.  Neck: Normal range of motion and full passive range of motion without pain. Neck supple.  Cardiovascular: Normal rate, regular rhythm and normal heart sounds.  Exam reveals no gallop and no friction rub.   No murmur heard. Pulmonary/Chest: Effort normal and breath sounds normal. No respiratory distress. She has no wheezes. She has no rhonchi. She has no rales. She exhibits no tenderness and no crepitus.  Abdominal: Soft. Normal appearance and bowel sounds are normal. She exhibits no distension. There is no tenderness. There is no rebound and no guarding.  Musculoskeletal: Normal range of motion. She exhibits no edema or tenderness.  Moves all extremities  well.   Neurological: She is alert and oriented to person, place, and time. She has normal strength. No cranial nerve deficit.  Skin: Skin is warm, dry and intact. No rash noted. No erythema. No pallor.  Psychiatric: She has a normal mood and affect. Her speech is normal and behavior is normal. Her mood appears not anxious.  Nursing note and vitals reviewed.    ED Treatments / Results   Procedures Procedures (including critical care time)  Medications Ordered in ED Medications  traMADol (ULTRAM) tablet 100 mg (100 mg Oral Given 08/22/16 0213)  acetaminophen (TYLENOL) tablet 650 mg (650 mg Oral Given 08/22/16 0213)  clindamycin (CLEOCIN) capsule 300 mg (300 mg Oral Given 08/22/16 0213)     Initial Impression / Assessment and Plan / ED Course  I have reviewed the triage vital signs and the nursing notes.  Pertinent labs & imaging results that were available during my care of the patient were reviewed by me and considered in my medical decision making (see chart for details).  Clinical Course    Patient is allergic to cephalosporins. She was started on clindamycin. She was started on tramadol for pain.   [place] of the [place] shows patient has gotten #20 hydrocodone 5/325 twice from a dentist on October 2 and November 10, and #30 hydrocodone 5/325 from Dr. Despina HiddenEure on November 11  second  Final Clinical Impressions(s) / ED Diagnoses   Final diagnoses:  Acute suppurative otitis media of left ear without spontaneous rupture of tympanic membrane, recurrence not specified    New Prescriptions New Prescriptions   CLINDAMYCIN (CLEOCIN) 300 MG CAPSULE    Take 1 capsule (300 mg total) by mouth every 6 (six) hours.   TRAMADOL (ULTRAM) 50 MG TABLET    Take 2 tablets (100 mg total) by mouth every 6 (six) hours as needed.    Plan discharge  Devoria AlbeIva Jes Costales, MD, Concha PyoFACEP    Eloise Mula, MD 08/22/16 [id number]

## 2016-08-22 NOTE — Discharge Instructions (Signed)
Take the antibiotics until gone. Take the tramadol with acetaminophen 650 mg 4 times a day for pain. Recheck if you get drainage from the ear, you get a high fever or lose hearing in your ear.  You can be reseen by Dr Suszanne Connerseoh, ENT if you aren't improving over the next 48 hours.

## 2016-08-22 NOTE — ED Triage Notes (Signed)
Pt says she woke tonight w/ left ear pain. Pt denies taking anything for pain.

## 2016-08-22 NOTE — ED Notes (Signed)
Pt states she just noticed she had a rash over her left eye & asked to notify EDP.

## 2016-09-02 NOTE — Patient Instructions (Signed)
Courtney Jacobson  09/02/2016     @PREFPERIOPPHARMACY @   Your procedure is scheduled on 09/09/2016.  Report to [facility] at 11:15 [facility].M.  Call this number if you have problems the morning of surgery:  [phone]   Remember:  Do not eat food or drink liquids after midnight.  Take these medicines the morning of surgery with A SIP OF WATER Tramadol if needed, Claritin   Do not wear jewelry, make-up or nail polish.  Do not wear lotions, powders, or perfumes, or deoderant.  Do not shave 48 hours prior to surgery.  Men may shave face and neck.  Do not bring valuables to the hospital.  [facility] is not responsible for any belongings or valuables.  Contacts, dentures or bridgework may not be worn into surgery.  Leave your suitcase in the car.  After surgery it may be brought to your room.  For patients admitted to the hospital, discharge time will be determined by your treatment team.  Patients discharged the day of surgery will not be allowed to drive home.    Please read over the following fact sheets that you were given. Surgical Site Infection Prevention and Anesthesia Post-op Instructions     PATIENT INSTRUCTIONS POST-ANESTHESIA  IMMEDIATELY FOLLOWING SURGERY:  Do not drive or operate machinery for the first twenty four hours after surgery.  Do not make any important decisions for twenty four hours after surgery or while taking narcotic pain medications or sedatives.  If you develop intractable nausea and vomiting or a severe headache please notify your doctor immediately.  FOLLOW-UP:  Please make an appointment with your surgeon as instructed. You do not need to follow up with anesthesia unless specifically instructed to do so.  WOUND CARE INSTRUCTIONS (if applicable):  Keep a dry clean dressing on the anesthesia/puncture wound site if there is drainage.  Once the wound has quit draining you may leave it open to air.  Generally you should leave the bandage intact for twenty four  hours unless there is drainage.  If the epidural site drains for more than 36-48 hours please call the anesthesia department.  QUESTIONS?:  Please feel free to call your physician or the hospital operator if you have any questions, and they will be happy to assist you.      Bilateral Salpingo-Oophorectomy Bilateral salpingo-oophorectomy is the surgical removal of both fallopian tubes and both ovaries. The ovaries are small organs that produce eggs in women. The fallopian tubes transport the egg from the ovary to the womb (uterus). Usually, when this surgery is done, the uterus was previously removed. A bilateral salpingo-oophorectomy may be done to treat cancer or to reduce the risk of cancer in women who are at high risk. Removing both fallopian tubes and both ovaries will make you unable to become pregnant (sterile). It will also put you into menopause so that you will no longer have menstrual periods and may have menopausal symptoms such as hot flashes, night sweats, and mood changes. It will not affect your sex drive. LET [facility] CARE PROVIDER KNOW ABOUT:  Any allergies you have.  All medicines you are taking, including vitamins, herbs, eye drops, creams, and over-the-counter medicines.  Previous problems you or members of your family have had with the use of anesthetics.  Any blood disorders you have.  Previous surgeries you have had.  Medical conditions you have. RISKS AND COMPLICATIONS Generally, this is a safe procedure. However, as with any procedure, complications can occur. Possible complications  include:  Injury to surrounding organs.  Bleeding.  Infection.  Blood clots in the legs or lungs.  Problems related to anesthesia. BEFORE THE PROCEDURE  Ask your health care provider about changing or stopping your regular medicines. You may need to stop taking certain medicines, such as aspirin or blood thinners, at least 1 week before the surgery.  Do not eat or drink  anything for at least 8 hours before the surgery.  If you smoke, do not smoke for at least 2 weeks before the surgery.  Make plans to have someone drive you home after the procedure or after your hospital stay. Also arrange for someone to help you with activities during recovery. PROCEDURE   You will be given medicine to help you relax before the procedure (sedative). You will then be given medicine to make you sleep through the procedure (general anesthetic). These medicines will be given through an IV access tube that is put into one of your veins.  Once you are asleep, your lower abdomen will be shaved and cleaned. A thin, flexible tube (catheter) will be placed in your bladder.  The surgeon may use a laparoscopic, robotic, or open technique for this surgery:  In the laparoscopic technique, the surgery is done through two small cuts (incisions) in the abdomen. A thin, lighted tube with a tiny camera on the end (laparoscope) is inserted into one of the incisions. The tools needed for the procedure are put through the other incision.  A robotic technique may be chosen to perform complex surgery in a small space. In the robotic technique, small incisions will be made. A camera and surgical instruments are passed through the incisions. Surgical instruments will be controlled with the help of a robotic arm.  In the open technique, the surgery is done through one large incision in the abdomen.  Using any of these techniques, the surgeon removes the fallopian tubes and ovaries. The blood vessels will be clamped and tied.  The surgeon then uses staples or stitches to close the incision or incisions. AFTER THE PROCEDURE  You will be taken to a recovery area where you will be monitored for 1 to 3 hours. Your blood pressure, pulse, and temperature will be checked often. You will remain in the recovery area until you are stable and waking up.  If the laparoscopic technique was used, you may be  allowed to go home after several hours. You may have some shoulder pain after the laparoscopic procedure. This is normal and usually goes away in a day or two.  If the open technique was used, you will be admitted to the hospital for a couple of days.  You will be given pain medicine as needed.  The IV access tube and catheter will be removed before you are discharged. This information is not intended to replace advice given to you by your health care provider. Make sure you discuss any questions you have with your health care provider. Document Released: 08/17/2005 Document Revised: 08/22/2013 Document Reviewed: 02/08/2013 Elsevier Interactive Patient Education  2017 [employer] Abdominal Hysterectomy Abdominal hysterectomy is a surgical procedure to remove your womb (uterus). Your uterus is the muscular organ that contains a developing baby. This surgery is done for many reasons. You may need an abdominal hysterectomy if you have cancer, growths (tumors), long-term pain, or bleeding. You may also have this procedure if your uterus has slipped down into your vagina (uterine prolapse). Depending on why you need an abdominal hysterectomy, you may  also have other reproductive organs removed. These could include the part of your vagina that connects with your uterus (cervix), the organs that make eggs (ovaries), and the tubes that connect the ovaries to the uterus (fallopian tubes). Tell a health care provider about:  Any allergies you have.  All medicines you are taking, including vitamins, herbs, eye drops, creams, and over-the-counter medicines.  Any problems you or family members have had with anesthetic medicines.  Any blood disorders you have.  Any surgeries you have had.  Any medical conditions you have. What are the risks? Generally, this is a safe procedure. However, as with any procedure, problems can occur. Infection is the most common problem after an abdominal hysterectomy.  Other possible problems include:  Bleeding.  Formation of blood clots that may break free and travel to your lungs.  Injury to other organs near your uterus.  Nerve injury causing nerve pain.  Decreased interest in sex or pain during sexual intercourse. What happens before the procedure?  Abdominal hysterectomy is a major surgical procedure. It can affect the way you feel about yourself. Talk to your health care provider about the physical and emotional changes hysterectomy may cause.  You may need to have blood work and X-rays done before surgery.  Quit smoking if you smoke. Ask your health care provider for help if you are struggling to quit.  Stop taking medicines that thin your blood as directed by your health care provider.  You may be instructed to take antibiotic medicines or laxatives before surgery.  Do not eat or drink anything for 6-8 hours before surgery.  Take your regular medicines with a small sip of water.  Bathe or shower the night or morning before surgery. What happens during the procedure?  Abdominal hysterectomy is done in the operating room at the hospital.  In most cases, you will be given a medicine that makes you go to sleep (general anesthetic).  The surgeon will make a cut (incision) through the skin in your lower belly.  The incision may be about 5-7 inches long. It may go side-to-side or up-and-down.  The surgeon will move aside the body tissue that covers your uterus. The surgeon will then carefully take out your uterus along with any of your other reproductive organs that need to be removed.  Bleeding will be controlled with clamps or sutures.  The surgeon will close your incision with sutures or metal clips. What happens after the procedure?  You will have some pain immediately after the procedure.  You will be given pain medicine in the recovery room.  You will be taken to your hospital room when you have recovered from the  anesthesia.  You may need to stay in the hospital for 2-5 days.  You will be given instructions for recovery at home. This information is not intended to replace advice given to you by your health care provider. Make sure you discuss any questions you have with your health care provider. Document Released: 08/22/2013 Document Revised: 01/23/2016 Document Reviewed: 06/09/2013 Elsevier Interactive Patient Education  2017 [employer]

## 2016-09-03 ENCOUNTER — Encounter: Payer: Medicaid Other | Admitting: Obstetrics & Gynecology

## 2016-09-03 ENCOUNTER — Other Ambulatory Visit: Payer: Self-pay | Admitting: Obstetrics & Gynecology

## 2016-09-04 ENCOUNTER — Encounter (HOSPITAL_COMMUNITY)
Admission: RE | Admit: 2016-09-04 | Discharge: 2016-09-04 | Disposition: A | Discharge status: Home / Self Care | Payer: Medicaid Other | Source: Ambulatory Visit | Attending: Obstetrics & Gynecology | Admitting: Obstetrics & Gynecology

## 2016-09-04 ENCOUNTER — Encounter (HOSPITAL_COMMUNITY): Payer: Self-pay

## 2016-09-07 ENCOUNTER — Encounter (HOSPITAL_COMMUNITY): Payer: Self-pay | Admitting: Anesthesiology

## 2016-09-09 ENCOUNTER — Inpatient Hospital Stay (HOSPITAL_COMMUNITY)
Admission: RE | Admit: 2016-09-09 | Payer: Medicaid Other | Source: Ambulatory Visit | Admitting: Obstetrics & Gynecology

## 2016-09-09 ENCOUNTER — Encounter (HOSPITAL_COMMUNITY): Admission: RE | Payer: Self-pay | Source: Ambulatory Visit

## 2016-09-09 SURGERY — HYSTERECTOMY, ABDOMINAL
Anesthesia: General

## 2016-09-22 ENCOUNTER — Encounter: Payer: Medicaid Other | Admitting: Obstetrics & Gynecology

## 2016-10-14 ENCOUNTER — Encounter (HOSPITAL_COMMUNITY): Payer: Self-pay | Admitting: Emergency Medicine

## 2016-10-14 ENCOUNTER — Emergency Department (HOSPITAL_COMMUNITY)
Admission: EM | Admit: 2016-10-14 | Discharge: 2016-10-14 | Disposition: A | Discharge status: Home / Self Care | Payer: Medicaid Other | Attending: Emergency Medicine | Admitting: Emergency Medicine

## 2016-10-14 ENCOUNTER — Emergency Department (HOSPITAL_COMMUNITY): Payer: Medicaid Other

## 2016-10-14 DIAGNOSIS — R5383 Other fatigue: Secondary | ICD-10-CM | POA: Insufficient documentation

## 2016-10-14 DIAGNOSIS — R509 Fever, unspecified: Secondary | ICD-10-CM | POA: Diagnosis not present

## 2016-10-14 DIAGNOSIS — F17210 Nicotine dependence, cigarettes, uncomplicated: Secondary | ICD-10-CM | POA: Diagnosis not present

## 2016-10-14 DIAGNOSIS — J111 Influenza due to unidentified influenza virus with other respiratory manifestations: Secondary | ICD-10-CM

## 2016-10-14 DIAGNOSIS — R062 Wheezing: Secondary | ICD-10-CM | POA: Insufficient documentation

## 2016-10-14 DIAGNOSIS — J029 Acute pharyngitis, unspecified: Secondary | ICD-10-CM | POA: Diagnosis not present

## 2016-10-14 DIAGNOSIS — R52 Pain, unspecified: Secondary | ICD-10-CM | POA: Insufficient documentation

## 2016-10-14 DIAGNOSIS — R69 Illness, unspecified: Secondary | ICD-10-CM

## 2016-10-14 DIAGNOSIS — R05 Cough: Secondary | ICD-10-CM | POA: Insufficient documentation

## 2016-10-14 LAB — RAPID STREP SCREEN (MED CTR MEBANE ONLY): Streptococcus, Group A Screen (Direct): NEGATIVE

## 2016-10-14 MED ORDER — PREDNISONE 10 MG PO TABS: 40.0000 mg | ORAL_TABLET | Freq: Every day | ORAL | 0 refills | Status: AC

## 2016-10-14 MED ORDER — PREDNISONE 20 MG PO TABS
40.0000 mg | ORAL_TABLET | Freq: Once | ORAL | Status: AC
  Administered 2016-10-14: 40 mg via ORAL
  Filled 2016-10-14: qty 2

## 2016-10-14 MED ORDER — ALBUTEROL SULFATE HFA 108 (90 BASE) MCG/ACT IN AERS
2.0000 | INHALATION_SPRAY | Freq: Once | RESPIRATORY_TRACT | Status: AC
  Administered 2016-10-14: 2 via RESPIRATORY_TRACT
  Filled 2016-10-14: qty 6.7

## 2016-10-14 MED ORDER — BENZONATATE 100 MG PO CAPS: 200.0000 mg | ORAL_CAPSULE | Freq: Three times a day (TID) | ORAL | 0 refills | Status: AC | PRN

## 2016-10-14 NOTE — ED Triage Notes (Signed)
Pt c/o body aches, fever, headache and chills since last night

## 2016-10-14 NOTE — ED Notes (Signed)
Respiratory paged

## 2016-10-14 NOTE — Discharge Instructions (Signed)
Take your next dose of prednisone tomorrow evening and use the inhaler given 2 puffs every 4 hours if you are coughing or wheezing.  Rest,  Drink plenty of fluids.  Take motrin or tylenol for achiness and fever reduction.   Get rechecked for increased shortness of breath,  Increased fever or increasing weakness.

## 2016-10-14 NOTE — ED Provider Notes (Signed)
[facility] DEPT Provider Note   CSN: [id number] Arrival date & time: 10/14/16  1957     History   Chief Complaint Chief Complaint  Patient presents with  . Generalized Body Aches    HPI Courtney Jacobson is a 31 y.o. female presenting with generalized body aches, subjective fever with intermittent chills, headache and sore throat which started suddenly last night. She reports having a nonproductive cough and has had wheezing, most persistently at night. She denies any known exposure to influenza but states all of her children have had strep throat within the past 4 weeks.  She has had ibuprofen for fever relief. She denies chest pain, sob, and has no n/v/d or abdominal pain.  Her daughter is also here to be seen for similar symptoms.  The history is provided by the patient.    Past Medical History:  Diagnosis Date  . Lyme disease     There are no active problems to display for this patient.   Past Surgical History:  Procedure Laterality Date  . ABDOMINAL SURGERY    . CESAREAN SECTION    . TONSILLECTOMY    . TUBAL LIGATION    . WISDOM TOOTH EXTRACTION      OB History    Gravida Para Term Preterm AB Living   7 6 3 3 1      SAB TAB Ectopic Multiple Live Births   1               Home Medications    Prior to Admission medications   Not on File    Family History History reviewed. No pertinent family history.  Social History Social History  Substance Use Topics  . Smoking status: Current Every Day Smoker    Packs/day: 0.50    Types: Cigarettes  . Smokeless tobacco: Never Used  . Alcohol use No     Allergies   Benadryl [diphenhydramine hcl (sleep)]; Ceclor [cefaclor]; Ibuprofen; and Vancomycin   Review of Systems Review of Systems  Constitutional: Positive for chills, fatigue and fever.  HENT: Positive for sore throat. Negative for congestion, ear pain, rhinorrhea, sinus pressure, trouble swallowing and voice change.   Eyes: Negative for discharge.    Respiratory: Positive for cough. Negative for shortness of breath, wheezing and stridor.   Cardiovascular: Negative for chest pain.  Gastrointestinal: Negative for abdominal pain.  Genitourinary: Negative.      Physical Exam Updated Vital Signs BP 101/63   Pulse 114   Temp 98.5 F (36.9 C)   Resp 18   Ht 5\' 5"  (1.651 m)   Wt 43.1 kg   LMP 10/14/2016   SpO2 97%   BMI 15.81 kg/m   Physical Exam  Constitutional: She is oriented to person, place, and time. She appears well-developed and well-nourished.  HENT:  Head: Normocephalic and atraumatic.  Right Ear: Tympanic membrane and ear canal normal.  Left Ear: Tympanic membrane and ear canal normal.  Nose: No mucosal edema or rhinorrhea.  Mouth/Throat: Uvula is midline and mucous membranes are normal. Posterior oropharyngeal erythema present. No oropharyngeal exudate, posterior oropharyngeal edema or tonsillar abscesses. Tonsils are 0 on the right. Tonsils are 0 on the left.  Eyes: Conjunctivae are normal.  Cardiovascular: Normal rate and normal heart sounds.   No murmur heard. Pulmonary/Chest: Effort normal. No respiratory distress. She has no wheezes. She has no rales.  Abdominal: Soft. She exhibits no distension. There is no tenderness.  Musculoskeletal: Normal range of motion.  Neurological: She is alert and oriented  to person, place, and time.  Skin: Skin is warm and dry. No rash noted.  Psychiatric: She has a normal mood and affect.     ED Treatments / Results  Labs (all labs ordered are listed, but only abnormal results are displayed) Labs Reviewed  RAPID STREP SCREEN (NOT AT [facility])  CULTURE, GROUP A STREP [facility])    EKG  EKG Interpretation None       Radiology Dg Chest 2 View  Result Date: 10/14/2016 CLINICAL DATA:  Cough and shortness of breath for 2 days. EXAM: CHEST  2 VIEW COMPARISON:  None. FINDINGS: Emphysematous changes in the lungs. Normal heart size and pulmonary vascularity. No focal airspace  disease or consolidation in the lungs. No blunting of costophrenic angles. No pneumothorax. Mediastinal contours appear intact. IMPRESSION: Emphysematous changes.  No evidence of active pulmonary disease. Electronically Signed   By: Burman Nieves M.D.   On: 10/14/2016 22:20    Procedures Procedures (including critical care time)  Medications Ordered in ED Medications  albuterol (PROVENTIL HFA;VENTOLIN HFA) 108 (90 Base) MCG/ACT inhaler 2 puff (2 puffs Inhalation Given 10/14/16 2223)     Initial Impression / Assessment and Plan / ED Course  I have reviewed the triage vital signs and the nursing notes.  Pertinent labs & imaging results that were available during my care of the patient were reviewed by me and considered in my medical decision making (see chart for details).     Labs reviewed, strep negative.  Flu like sx, discussed tamiflu, pt deferred.  Discussed hydration, tylenol or motrin for pain and fever reduction, rest.  Recheck for any new or worsened sx. Pt given albuterol mdi dosing here with improved breath sounds.  Still with sparse right expiratory wheeze.  She will use home albuterol 2 puffs q 4 hours prn cough and wheeze.  Prednisone 40 mg 4 day pulse dose.   Final Clinical Impressions(s) / ED Diagnoses   Final diagnoses:  Influenza-like illness    New Prescriptions New Prescriptions   No medications on file     Burgess Amor, PA-C 10/14/16 2319    Burgess Amor, PA-C 10/14/16 2332    Linwood Dibbles, MD 10/15/16 1730

## 2016-10-17 LAB — CULTURE, GROUP A STREP (THRC)

## 2016-12-01 DIAGNOSIS — J40 Bronchitis, not specified as acute or chronic: Secondary | ICD-10-CM | POA: Insufficient documentation

## 2016-12-01 DIAGNOSIS — F17210 Nicotine dependence, cigarettes, uncomplicated: Secondary | ICD-10-CM | POA: Diagnosis not present

## 2016-12-01 DIAGNOSIS — R0602 Shortness of breath: Secondary | ICD-10-CM | POA: Diagnosis present

## 2016-12-02 ENCOUNTER — Emergency Department (HOSPITAL_COMMUNITY): Payer: Medicaid Other

## 2016-12-02 ENCOUNTER — Emergency Department (HOSPITAL_COMMUNITY)
Admission: EM | Admit: 2016-12-02 | Discharge: 2016-12-02 | Disposition: A | Discharge status: Home / Self Care | Payer: Medicaid Other | Attending: Emergency Medicine | Admitting: Emergency Medicine

## 2016-12-02 ENCOUNTER — Encounter (HOSPITAL_COMMUNITY): Payer: Self-pay | Admitting: *Deleted

## 2016-12-02 DIAGNOSIS — J40 Bronchitis, not specified as acute or chronic: Secondary | ICD-10-CM

## 2016-12-02 MED ORDER — ALBUTEROL SULFATE HFA 108 (90 BASE) MCG/ACT IN AERS: 2.0000 | INHALATION_SPRAY | Freq: Four times a day (QID) | RESPIRATORY_TRACT | 0 refills | Status: AC | PRN

## 2016-12-02 MED ORDER — DOXYCYCLINE HYCLATE 100 MG PO CAPS: 100.0000 mg | ORAL_CAPSULE | Freq: Two times a day (BID) | ORAL | 0 refills | Status: AC

## 2016-12-02 NOTE — ED Provider Notes (Signed)
[facility] Provider Note   CSN: [id number] Arrival date & time: 12/01/16  2320     History   Chief Complaint Chief Complaint  Patient presents with  . Shortness of Breath    HPI Courtney Jacobson is a 31 y.o. female.  Patient with history of asthma who continues to smoke presenting with 1 month of difficulty breathing with cough productive of clear mucus. Also reports rhinorrhea, sore throat, congestion. She's been using albuterol every 4 hours without relief. She is concerned that she's been exposed to mold at her house. Denies fever. Has some chest and back pain with coughing. No abdominal pain, nausea or vomiting. She's never been admitted for asthma. She normally uses albuterol only as needed. She is here tonight because her son is also a patient.   The history is provided by the patient.  Shortness of Breath  Associated symptoms include rhinorrhea and cough. Pertinent negatives include no fever, no headaches, no chest pain, no vomiting and no abdominal pain.    Past Medical History:  Diagnosis Date  . Lyme disease     There are no active problems to display for this patient.   Past Surgical History:  Procedure Laterality Date  . ABDOMINAL SURGERY    . CESAREAN SECTION    . TONSILLECTOMY    . TUBAL LIGATION    . WISDOM TOOTH EXTRACTION      OB History    Gravida Para Term Preterm AB Living   SAB TAB Ectopic Multiple Live Births   1               Home Medications    Prior to Admission medications   Medication Sig Start Date End Date Taking? Authorizing Provider  benzonatate (TESSALON) 100 MG capsule Take 2 capsules (200 mg total) by mouth 3 (three) times daily as needed. 10/14/16   Burgess Amor, PA-C    Family History History reviewed. No pertinent family history.  Social History Social History  Substance Use Topics  . Smoking status: Current Every Day Smoker    Packs/day: 0.50    Types: Cigarettes  . Smokeless tobacco: Never Used  .  Alcohol use No     Allergies   Benadryl [diphenhydramine hcl (sleep)]; Ceclor [cefaclor]; Ibuprofen; and Vancomycin   Review of Systems Review of Systems  Constitutional: Negative for activity change, appetite change and fever.  HENT: Positive for congestion and rhinorrhea.   Respiratory: Positive for cough and shortness of breath.   Cardiovascular: Negative for chest pain.  Gastrointestinal: Negative for abdominal pain, nausea and vomiting.  Genitourinary: Negative for dysuria and hematuria.  Musculoskeletal: Negative for arthralgias, back pain and myalgias.  Neurological: Negative for dizziness, weakness and headaches.   A complete 10 system review of systems was obtained and all systems are negative except as noted in the HPI and PMH.    Physical Exam Updated Vital Signs BP 110/61 (BP Location: Left Arm)   Pulse 70   Temp 99 F (37.2 C) (Oral)   Resp 16   Ht  (1.651 m)   Wt 99 lb (44.9 kg)   LMP 11/11/2016   SpO2 96%   BMI 16.47 kg/m   Physical Exam  Constitutional: She is oriented to person, place, and time. She appears well-developed and well-nourished. No distress.  HENT:  Head: Normocephalic and atraumatic.  Mouth/Throat: Oropharynx is clear and moist. No oropharyngeal exudate.  Eyes: Conjunctivae and EOM are normal. Pupils  are equal, round, and reactive to light.  Neck: Normal range of motion. Neck supple.  No meningismus.  Cardiovascular: Normal rate, regular rhythm, normal heart sounds and intact distal pulses.   No murmur heard. Pulmonary/Chest: Effort normal and breath sounds normal. No respiratory distress. She has no wheezes.  Abdominal: Soft. There is no tenderness. There is no rebound and no guarding.  Musculoskeletal: Normal range of motion. She exhibits no edema or tenderness.  Neurological: She is alert and oriented to person, place, and time. No cranial nerve deficit. She exhibits normal muscle tone. Coordination normal.   5/5 strength  throughout. CN 2-12 intact.Equal grip strength.   Skin: Skin is warm.  Psychiatric: She has a normal mood and affect. Her behavior is normal.  Nursing note and vitals reviewed.    ED Treatments / Results  Labs (all labs ordered are listed, but only abnormal results are displayed) Labs Reviewed - No data to display  EKG  EKG Interpretation None       Radiology Dg Chest 2 View  Result Date: 12/02/2016 CLINICAL DATA:  Dyspnea since February EXAM: CHEST  2 VIEW COMPARISON:  None. FINDINGS: Hyperinflated lungs without pulmonary consolidation, effusion or pneumothorax. Heart and mediastinal contours are normal. No acute osseous abnormality seen. IMPRESSION: Hyperinflated lungs similar to prior exam. No acute pulmonary consolidation or CHF. Electronically Signed   By: Tollie Eth M.D.   On: 12/02/2016 02:52    Procedures Procedures (including critical care time)  Medications Ordered in ED Medications - No data to display   Initial Impression / Assessment and Plan / ED Course  I have reviewed the triage vital signs and the nursing notes.  Pertinent labs & imaging results that were available during my care of the patient were reviewed by me and considered in my medical decision making (see chart for details).     Smoker with ongoing shortness of breath for the past 1 month. She is in no distress. No hypoxia. Lungs are clear without wheezing.  PERC negative.  Chest x-ray negative. Not wheezing on exam. No hypoxia. We'll treat for bronchitis. Needs to stop smoking. Follow-up with PCP. Return precautions discussed.  Final Clinical Impressions(s) / ED Diagnoses   Final diagnoses:  Bronchitis    New Prescriptions New Prescriptions   No medications on file     Glynn Octave, MD 12/02/16 [phone]

## 2016-12-02 NOTE — ED Triage Notes (Signed)
Pt c/o sob since February; pt states she has been using her inhaler with no relief

## 2016-12-02 NOTE — Discharge Instructions (Signed)
Stop smoking. Take antibiotics as prescribed. Use inhaler as needed. Return to the ED if you develop new or worsening symptoms.

## 2018-05-09 IMAGING — CT CT ABD-PELV W/ CM
2 of 4 series · 15 of 46 positions shown, 17 images · IV contrast (iopamidol)
Comparison: None.

ADDENDUM:
The statement in the impression regarding intrarenal calculi is in
error. There are multiple small nonobstructing calculi throughout
both kidneys. No hydronephrosis evident.
CLINICAL DATA: Lower abdominal pain, primarily left-sided, with
nausea

EXAM:
CT ABDOMEN AND PELVIS WITH CONTRAST
TECHNIQUE: Multidetector CT imaging of the abdomen and pelvis was performed
using the standard protocol following bolus administration of
intravenous contrast. Oral contrast was also administered.
CONTRAST:  75mL 0YZTRA-CVV IOPAMIDOL (0YZTRA-CVV) INJECTION 61%

[Series 2: axial st · axial · 0.60mm/px · z∈[+682,+1047]mm · 12 of 84 slices shown, 14 images]
[im 7/84  soft-tissue]
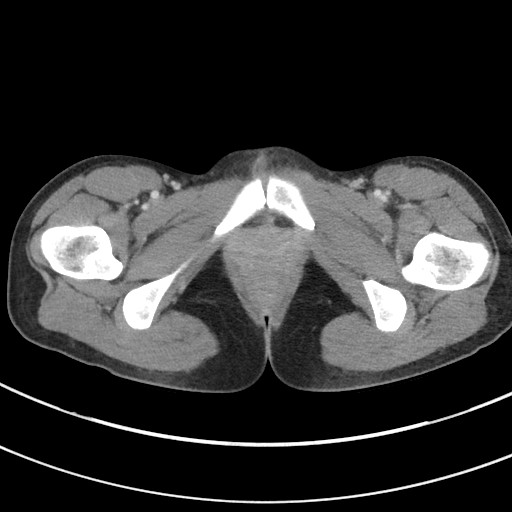
[im 7/84  bone]
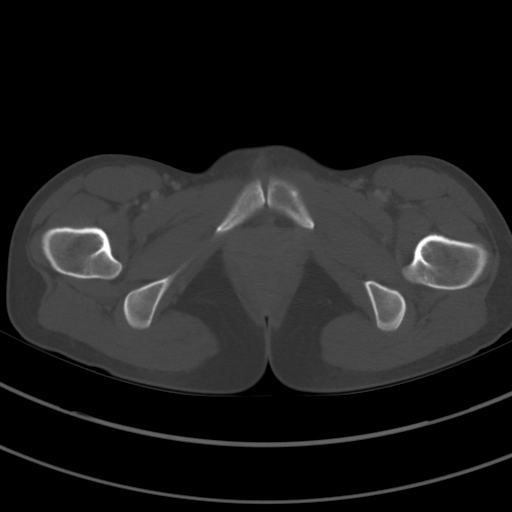
[im 14/84  soft-tissue]
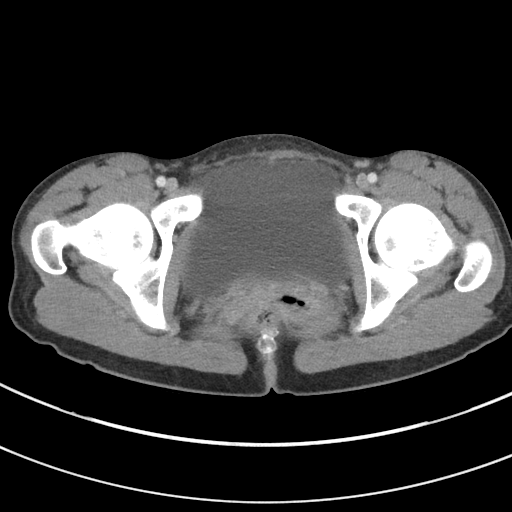
[im 20/84  soft-tissue]
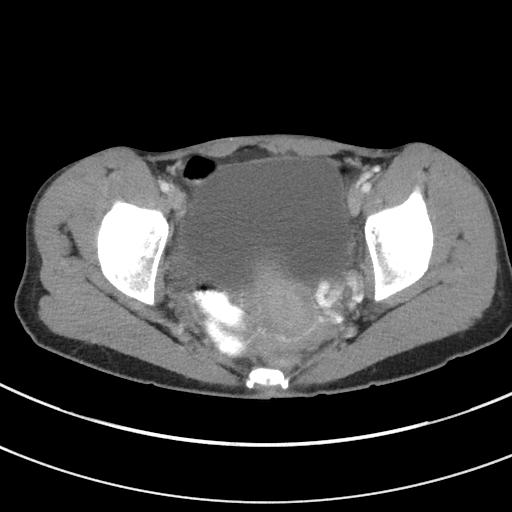
[im 27/84  soft-tissue]
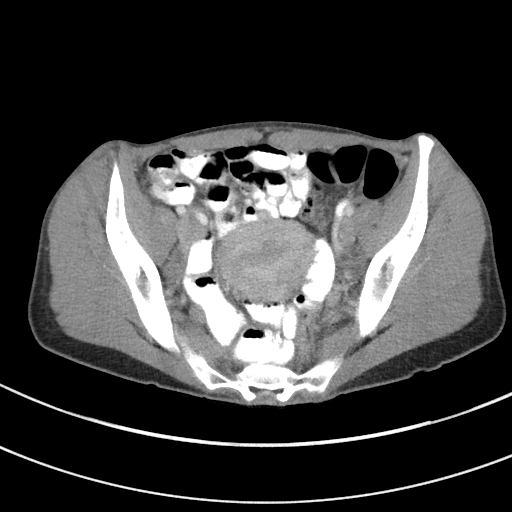
[im 34/84  soft-tissue]
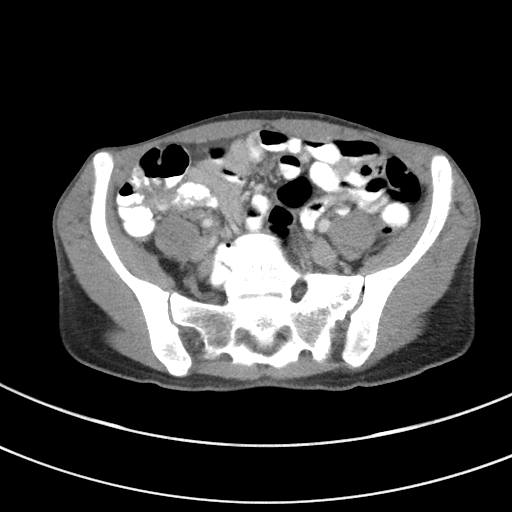
[im 40/84  soft-tissue]
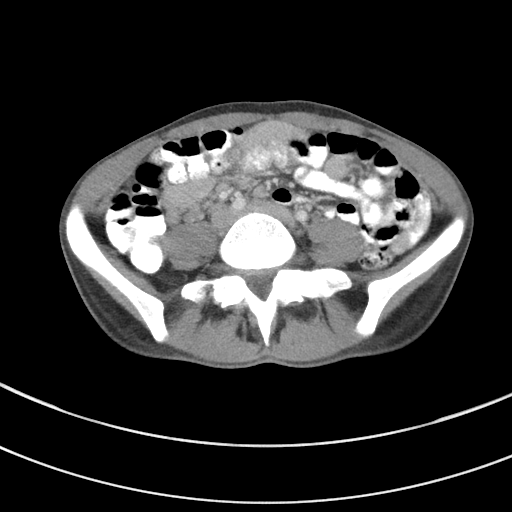
[im 47/84  soft-tissue]
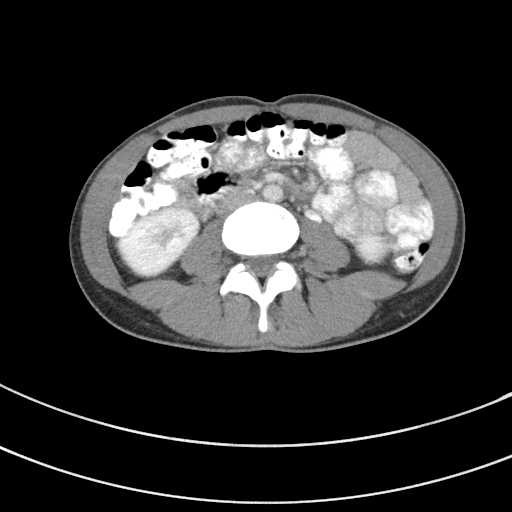
[im 54/84  soft-tissue]
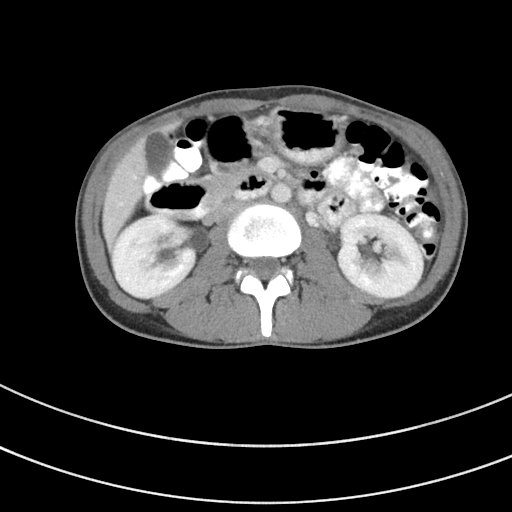
[im 60/84  soft-tissue]
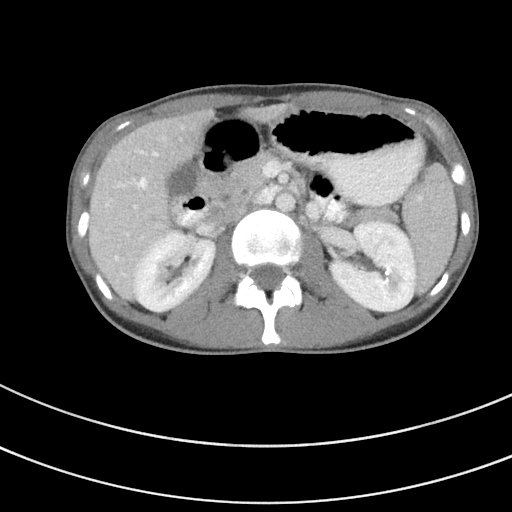
[im 60/84  bone]
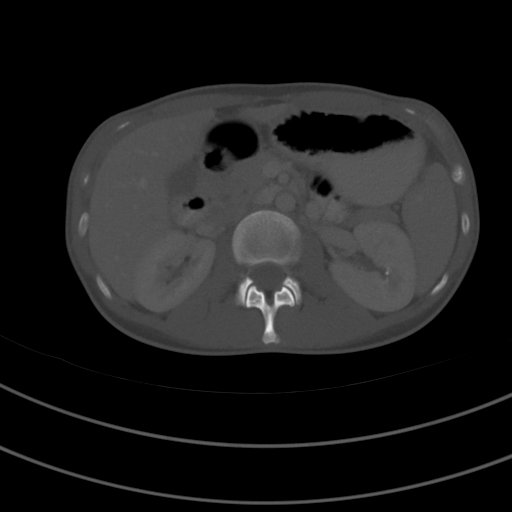
[im 67/84  soft-tissue]
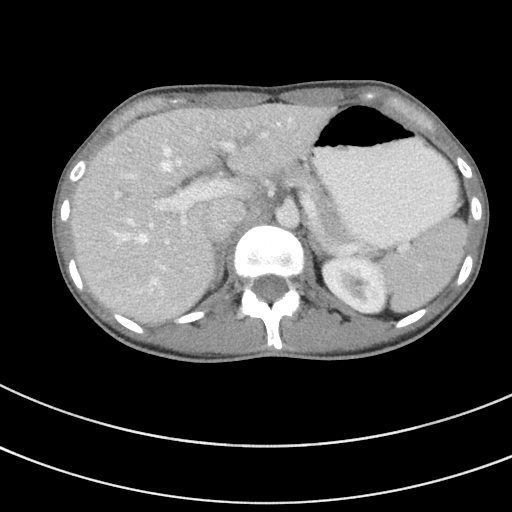
[im 74/84  soft-tissue]
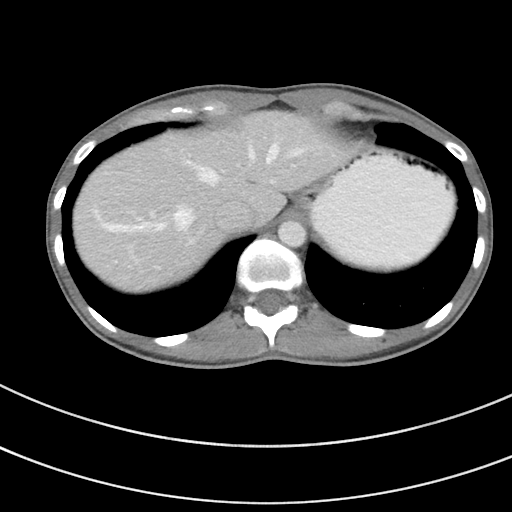
[im 80/84  soft-tissue]
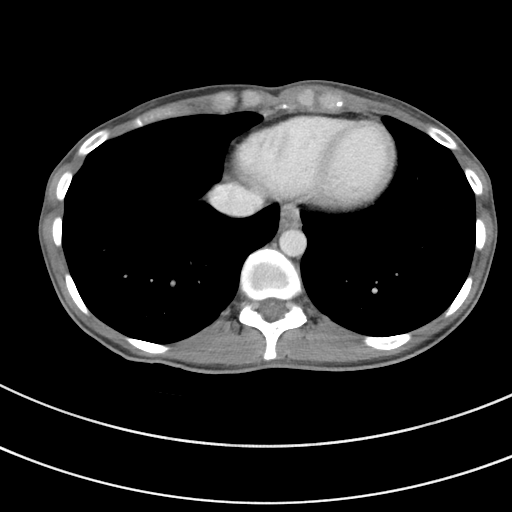

[Series 5: coronal st · coronal · 0.53mm/px · 3 of 58 slices shown]
[im 20/58  soft-tissue]
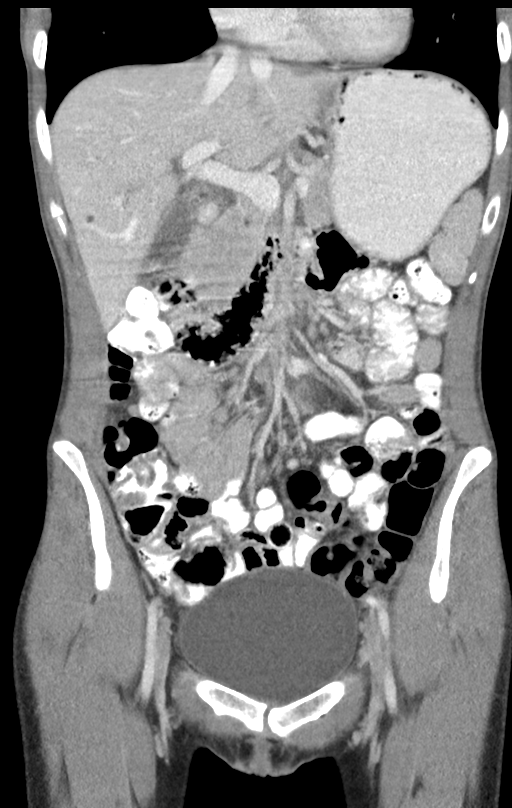
[im 26/58  soft-tissue]
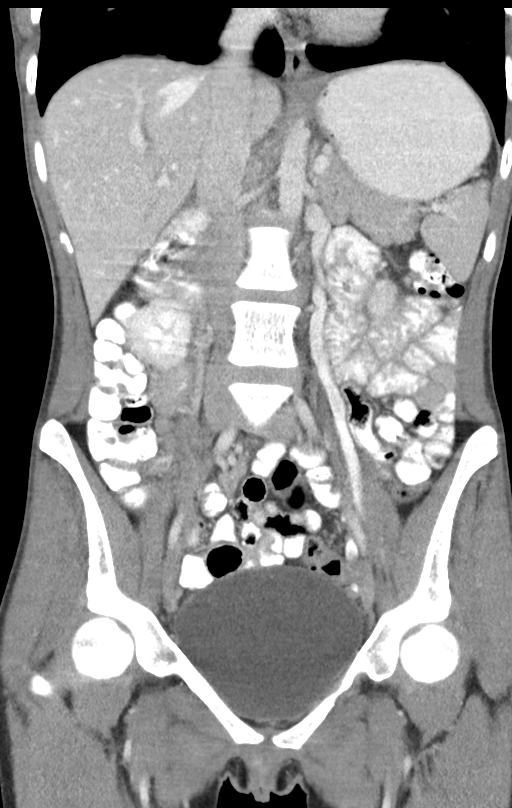
[im 32/58  soft-tissue]
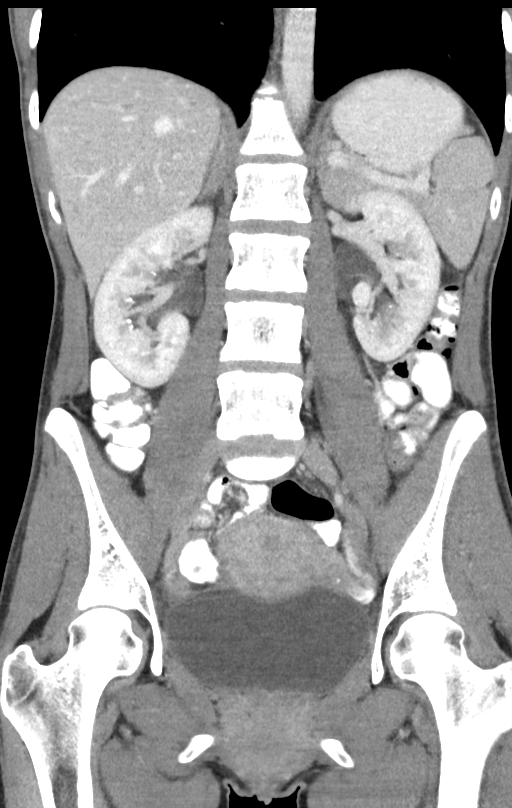

[15 of 46 positions shown; findings below may reference images not displayed]

FINDINGS: Lower chest: Lung bases are clear.

Hepatobiliary: There are scattered tiny foci of decreased
attenuation, likely cysts, in the liver. Liver otherwise appears
unremarkable. Gallbladder wall is not appreciably thickened. There
is no biliary duct dilatation.

Pancreas: No pancreatic mass or inflammatory focus.

Spleen: No splenic lesions are evident.

Adrenals/Urinary Tract: Adrenals appear normal bilaterally. There is
no renal mass or hydronephrosis on either side. There are scattered
calculi throughout the kidneys on each side ranging in size from
between 1 and 3 mm. There is no evident ureteral calculus on either
side. Urinary bladder is midline with wall thickness within normal
limits.

Stomach/Bowel: There is no appreciable bowel wall or mesenteric
thickening. No bowel obstruction. No free air or portal venous air.

Vascular/Lymphatic: There is no abdominal aortic aneurysm. No
vascular lesions are evident. No adenopathy is appreciable in the
abdomen pelvis.

Reproductive: Uterus is anteverted. There is no appreciable pelvic
mass or pelvic fluid collection.

Other: Appendix appears normal. There is no abscess or ascites in
the abdomen or pelvis.

Musculoskeletal: There is benign-appearing sclerosis in the upper
left sacral ala. No lytic or destructive bone lesions are
identified. No abdominal wall or intramuscular lesion evident.
IMPRESSION: No bowel obstruction. No abscess. No evidence of diverticulitis.
Appendix appears normal.

No renal or ureteral calculus. No hydronephrosis. A cause for
patient's symptoms has not been established with this study.
